# Patient Record
Sex: Male | Born: 1950 | Hispanic: Yes | Marital: Single | Smoking: Never smoker
Health system: Southern US, Community
[De-identification: ages and names within clinical notes are randomized; demographics above are authoritative.]

## PROBLEM LIST (undated history)

## (undated) DIAGNOSIS — E119 Type 2 diabetes mellitus without complications: Secondary | ICD-10-CM

---

## 2018-01-04 ENCOUNTER — Encounter: Payer: Self-pay | Admitting: Intensive Care

## 2018-01-04 ENCOUNTER — Emergency Department: Payer: Self-pay

## 2018-01-04 ENCOUNTER — Emergency Department
Admission: EM | Admit: 2018-01-04 | Discharge: 2018-01-04 | Disposition: A | Discharge status: Home / Self Care | Payer: Self-pay | Attending: Student in an Organized Health Care Education/Training Program | Admitting: Student in an Organized Health Care Education/Training Program

## 2018-01-04 ENCOUNTER — Other Ambulatory Visit: Payer: Self-pay

## 2018-01-04 DIAGNOSIS — I10 Essential (primary) hypertension: Secondary | ICD-10-CM | POA: Insufficient documentation

## 2018-01-04 DIAGNOSIS — E1165 Type 2 diabetes mellitus with hyperglycemia: Secondary | ICD-10-CM | POA: Insufficient documentation

## 2018-01-04 DIAGNOSIS — R739 Hyperglycemia, unspecified: Secondary | ICD-10-CM

## 2018-01-04 HISTORY — DX: Type 2 diabetes mellitus without complications: E11.9

## 2018-01-04 LAB — CBC WITH DIFFERENTIAL/PLATELET
BASOS ABS: 0.1 10*3/uL (ref 0–0.1)
BASOS PCT: 1 %
EOS ABS: 0.3 10*3/uL (ref 0–0.7)
EOS PCT: 4 %
HEMATOCRIT: 39.5 % — AB (ref 40.0–52.0)
Hemoglobin: 14 g/dL (ref 13.0–18.0)
Lymphocytes Relative: 22 %
Lymphs Abs: 1.6 10*3/uL (ref 1.0–3.6)
MCH: 31.2 pg (ref 26.0–34.0)
MCHC: 35.5 g/dL (ref 32.0–36.0)
MCV: 87.9 fL (ref 80.0–100.0)
MONOS PCT: 6 %
Monocytes Absolute: 0.4 10*3/uL (ref 0.2–1.0)
NEUTROS ABS: 4.8 10*3/uL (ref 1.4–6.5)
Neutrophils Relative %: 67 %
Platelets: 256 10*3/uL (ref 150–440)
RBC: 4.5 MIL/uL (ref 4.40–5.90)
RDW: 12.9 % (ref 11.5–14.5)
WBC: 7.1 10*3/uL (ref 3.8–10.6)

## 2018-01-04 LAB — BASIC METABOLIC PANEL
Anion gap: 9 (ref 5–15)
BUN: 33 mg/dL — ABNORMAL HIGH (ref 6–20)
CALCIUM: 9.2 mg/dL (ref 8.9–10.3)
CO2: 25 mmol/L (ref 22–32)
CREATININE: 1.51 mg/dL — AB (ref 0.61–1.24)
Chloride: 101 mmol/L (ref 101–111)
GFR, EST AFRICAN AMERICAN: 53 mL/min — AB (ref >60)
GFR, EST NON AFRICAN AMERICAN: 46 mL/min — AB (ref >60)
Glucose, Bld: 231 mg/dL — ABNORMAL HIGH (ref 65–99)
Potassium: 3.9 mmol/L (ref 3.5–5.1)
Sodium: 135 mmol/L (ref 135–145)

## 2018-01-04 LAB — TROPONIN I: Troponin I: <0.03 ng/mL (ref <0.03)

## 2018-01-04 MED ORDER — AMLODIPINE BESYLATE 5 MG PO TABS
5.0000 mg | ORAL_TABLET | Freq: Once | ORAL | Status: AC
  Administered 2018-01-04: 5 mg via ORAL
  Filled 2018-01-04: qty 1

## 2018-01-04 MED ORDER — AMLODIPINE BESYLATE 5 MG PO TABS: 5.0000 mg | ORAL_TABLET | Freq: Every day | ORAL | 11 refills | Status: AC

## 2018-01-04 MED ORDER — METFORMIN HCL 500 MG PO TABS: 500.0000 mg | ORAL_TABLET | Freq: Two times a day (BID) | ORAL | 11 refills | Status: AC

## 2018-01-04 NOTE — ED Triage Notes (Signed)
Patient states "[facility] and they sent me here for HTN and poor blood flow to feet. Feet have purplish tints in some spots" Patient states he is a diabetic but has not taken meds for this in two years

## 2018-01-04 NOTE — ED Provider Notes (Signed)
[facility] Emergency Department Provider Note    First MD Initiated Contact with Patient 01/04/18 1525     (approximate)  I have reviewed the triage vital signs and the nursing notes.   HISTORY  Chief Complaint Hypertension    HPI Scott Bradford is a 67 y.o. male is [place] with chief complaint of elevated blood pressure.  Patient has also noted spots on his legs for the past several months.  Is not been on any blood pressure medication or diabetes medication over 4 years.  States he is also had mild headache for the past several weeks.  No fevers.  No neck pain.  No chest pain or shortness of breath.  Like something for his blood pressure.  Does not have a primary care physician here because he is not established here and is planning on leaving on the 22nd.  Denies any other concerns at this time.    Past Medical History:  Diagnosis Date  . Diabetes mellitus without complication (HCC)    History reviewed. No pertinent family history. History reviewed. No pertinent surgical history. There are no active problems to display for this patient.     Prior to Admission medications   Medication Sig Start Date End Date Taking? Authorizing Provider  amLODipine (NORVASC) 5 MG tablet Take 1 tablet (5 mg total) by mouth daily. 01/04/18 01/04/19  Willy Eddyobinson, Robertine Kipper, MD  metFORMIN (GLUCOPHAGE) 500 MG tablet Take 1 tablet (500 mg total) by mouth 2 (two) times daily with a meal. 01/04/18 01/04/19  Willy Eddyobinson, Shanielle Correll, MD    Allergies Patient has no known allergies.    Social History Social History   Tobacco Use  . Smoking status: Never Smoker  . Smokeless tobacco: Never Used  Substance Use Topics  . Alcohol use: Not Currently  . Drug use: Not on file    Review of Systems Patient denies headaches, rhinorrhea, blurry vision, numbness, shortness of breath, chest pain, edema, cough, abdominal pain, nausea, vomiting, diarrhea, dysuria, fevers, rashes  or hallucinations unless otherwise stated above in HPI. ____________________________________________   PHYSICAL EXAM:  VITAL SIGNS: Vitals:   01/04/18 1533 01/04/18 1600  BP:  (!) 173/105  Pulse: 70 68  Resp:  17  Temp:    SpO2: 98% 100%    Constitutional: Alert and oriented.  Eyes: Conjunctivae are normal.  Head: Atraumatic. Nose: No congestion/rhinnorhea. Mouth/Throat: Mucous membranes are moist.   Neck: No stridor. Painless ROM.  Cardiovascular: Normal rate, regular rhythm. Grossly normal heart sounds.  Good peripheral circulation. Respiratory: Normal respiratory effort.  No retractions. Lungs CTAB. Gastrointestinal: Soft and nontender. No distention. No abdominal bruits. No CVA tenderness. Genitourinary:  Musculoskeletal: No lower extremity tenderness nor edema.  Brisk cap refill.  2+ DP and PT pulses.  No joint effusions. Neurologic:  Normal speech and language. No gross focal neurologic deficits are appreciated. No facial droop Skin:  Skin is warm, dry and intact. No rash noted. Psychiatric: Mood and affect are normal. Speech and behavior are normal.  ____________________________________________   LABS (all labs ordered are listed, but only abnormal results are displayed)  Results for orders placed or performed during the hospital encounter of 01/04/18 (from the past 24 hour(s))  CBC with Differential     Status: Abnormal   Collection Time: 01/04/18  2:35 PM  Result Value Ref Range   WBC 7.1 3.8 - 10.6 K/uL   RBC 4.50 4.40 - 5.90 MIL/uL   Hemoglobin 14.0 13.0 - 18.0 g/dL  HCT 39.5 (L) 40.0 - 52.0 %   MCV 87.9 80.0 - 100.0 fL   MCH 31.2 26.0 - 34.0 pg   MCHC 35.5 32.0 - 36.0 g/dL   RDW 40.9 81.1 - 91.4 %   Platelets 256 150 - 440 K/uL   Neutrophils Relative % 67 %   Neutro Abs 4.8 1.4 - 6.5 K/uL   Lymphocytes Relative 22 %   Lymphs Abs 1.6 1.0 - 3.6 K/uL   Monocytes Relative 6 %   Monocytes Absolute 0.4 0.2 - 1.0 K/uL   Eosinophils Relative 4 %    Eosinophils Absolute 0.3 0 - 0.7 K/uL   Basophils Relative 1 %   Basophils Absolute 0.1 0 - 0.1 K/uL  Basic metabolic panel     Status: Abnormal   Collection Time: 01/04/18  2:35 PM  Result Value Ref Range   Sodium 135 135 - 145 mmol/L   Potassium 3.9 3.5 - 5.1 mmol/L   Chloride 101 101 - 111 mmol/L   CO2 25 22 - 32 mmol/L   Glucose, Bld 231 (H) 65 - 99 mg/dL   BUN 33 (H) 6 - 20 mg/dL   Creatinine, Ser 7.82 (H) 0.61 - 1.24 mg/dL   Calcium 9.2 8.9 - 95.6 mg/dL   GFR calc non Af Amer 46 (L) >60 mL/min   GFR calc Af Amer 53 (L) >60 mL/min   Anion gap 9 5 - 15  Troponin I     Status: None   Collection Time: 01/04/18  4:00 PM  Result Value Ref Range   Troponin I <0.03 <0.03 ng/mL   ____________________________________________  EKG My review and personal interpretation at Time: 15:44   Indication: htn  Rate: 70  Rhythm: sinus Axis: normal Other: normal intervals, no stemi ____________________________________________  RADIOLOGY  I personally reviewed all radiographic images ordered to evaluate for the above acute complaints and reviewed radiology reports and findings.  These findings were personally discussed with the patient.  Please see medical record for radiology report.  ____________________________________________   PROCEDURES  Procedure(s) performed:  Procedures    Critical Care performed: no ____________________________________________   INITIAL IMPRESSION / ASSESSMENT AND PLAN / ED COURSE  Pertinent labs & imaging results that were available during my care of the patient were reviewed by me and considered in my medical decision making (see chart for details).   DDX: htn, diabetes, chf, renal dysfunction, medication noncompliance  Scott Bradford is a 67 y.o. who presents to the ED with Non-distressed patient presenting with concern for elevated BP and glucose. Patient is AF,VSS with HTN in ED. Exam as above. Given current presentation have considered the  above differential.Has not been taking antihypertensive medications.. . Extensive evaluation of possible end organ damage pursued in ED.  Borderline ckd, but no evidence of acute renal dysfunction with acidosis or electrolyte durangement. Neuro exam without focal deficits. EKG without evidence of ischemia. Trop negative. Not consistent with CHF, malignant htn, adrenergic crisis or hypertensive emergency.   Will have patient start both a daily antihypertensive medication as well as metformin which was previously on.  Discussed strict return parameters.       ____________________________________________   FINAL CLINICAL IMPRESSION(S) / ED DIAGNOSES  Final diagnoses:  Essential hypertension  Elevated blood sugar      NEW MEDICATIONS STARTED DURING THIS VISIT:  New Prescriptions   AMLODIPINE (NORVASC) 5 MG TABLET    Take 1 tablet (5 mg total) by mouth daily.   METFORMIN (GLUCOPHAGE) 500 MG TABLET  Take 1 tablet (500 mg total) by mouth 2 (two) times daily with a meal.     Note:  This document was prepared using Dragon voice recognition software and may include unintentional dictation errors.    Willy Eddy, MD 01/04/18 [phone]

## 2019-03-12 IMAGING — DX DG CHEST 1V PORT
1 series · 1 of 1 positions shown · non-contrast
Comparison: None.

CLINICAL DATA: Hypertension.

EXAM:
PORTABLE CHEST 1 VIEW

[chest ap]
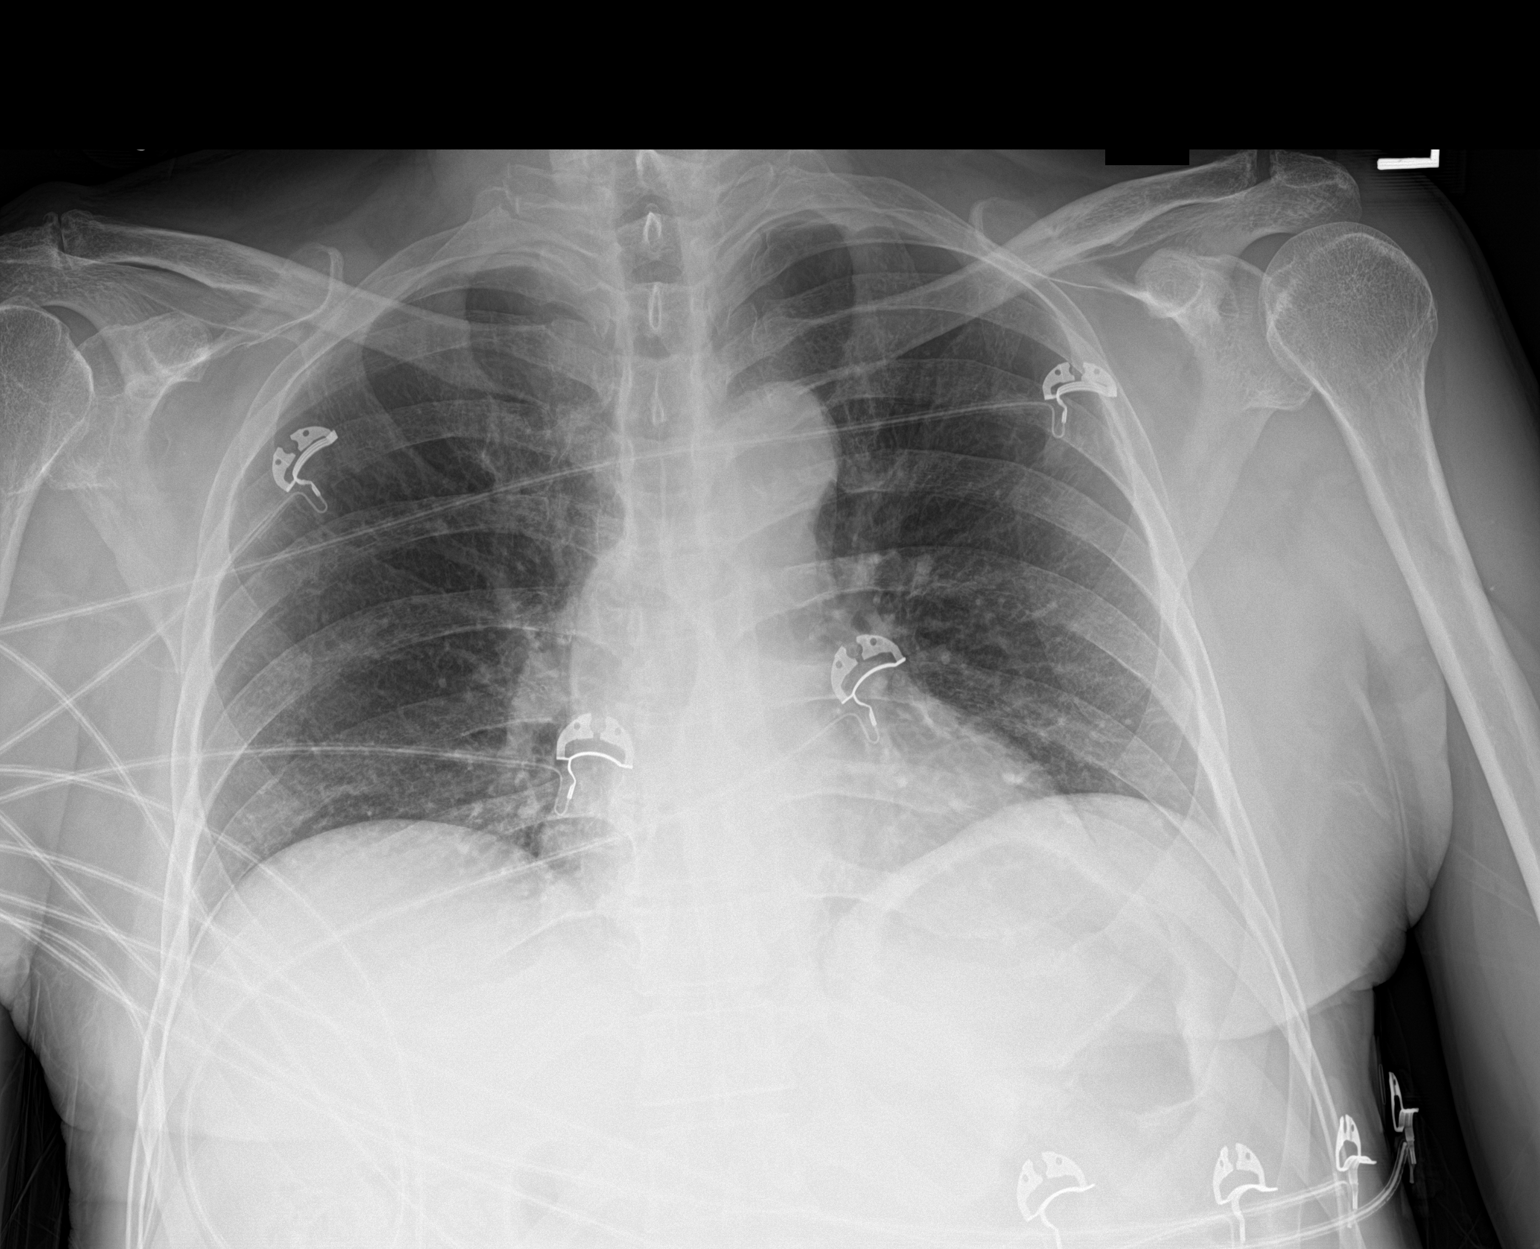

[1 of 1 positions shown; findings below may reference images not displayed]

FINDINGS: The heart size and mediastinal contours are within normal limits.
Both lungs are clear. The visualized skeletal structures are
unremarkable.
IMPRESSION: No active disease.
